# Patient Record
Sex: Female | Born: 1960 | Hispanic: No | Marital: Married | Smoking: Never smoker
Health system: Southern US, Community
[De-identification: ages and names within clinical notes are randomized; demographics above are authoritative.]

## PROBLEM LIST (undated history)

## (undated) DIAGNOSIS — N393 Stress incontinence (female) (male): Secondary | ICD-10-CM

## (undated) HISTORY — PX: APPENDECTOMY: SHX54

---

## 2003-01-07 ENCOUNTER — Other Ambulatory Visit: Admission: RE | Admit: 2003-01-07 | Discharge: 2003-01-07 | Payer: Self-pay | Admitting: Obstetrics and Gynecology

## 2004-08-10 ENCOUNTER — Other Ambulatory Visit: Admission: RE | Admit: 2004-08-10 | Discharge: 2004-08-10 | Payer: Self-pay | Admitting: Obstetrics and Gynecology

## 2005-09-06 ENCOUNTER — Other Ambulatory Visit: Admission: RE | Admit: 2005-09-06 | Discharge: 2005-09-06 | Payer: Self-pay | Admitting: Obstetrics and Gynecology

## 2006-10-31 ENCOUNTER — Encounter: Admission: RE | Admit: 2006-10-31 | Discharge: 2006-10-31 | Payer: Self-pay | Admitting: Obstetrics and Gynecology

## 2010-08-23 ENCOUNTER — Encounter: Payer: Self-pay | Admitting: Obstetrics and Gynecology

## 2013-02-28 NOTE — H&P (Signed)
  Patient name  Sheena, Green DICTATION#  [id number] CSN# [id number]  [facility], MD 02/28/2013 6:19 AM

## 2013-03-01 NOTE — H&P (Signed)
NAME:  Sheena Green, Sheena Green NO.:  [id number]  MEDICAL RECORD NO.:  [id number]  LOCATION:                                 FACILITY:  PHYSICIAN:  Juluis Mire, M.D.        DATE OF BIRTH:  DATE OF ADMISSION: DATE OF DISCHARGE:                             HISTORY & PHYSICAL   The date of her surgery is March 09, 2013, at [facility], [facility] outpatient area.  HISTORY OF PRESENT ILLNESS:  The patient is a 52 year old, gravida 3, para 2, married female who presents for mid urethral sling and cystoscopy.  In relation to the present admission, the patient has been having trouble with worsening stress incontinence.  She leaks with coughing, sneezing, and activity.  This has become a limiting issue.  Because of this, she underwent urodynamic testing in the office.  She had a minimal residual.  She had normal leak point pressures.  She had one of those spuriously low at 50, but the rest were okay.  Her urethral pressure profile was normal.  There was no evidence of any type of overactive bladder activity.  Now in consultation, we also discussed options including physical therapy versus mid urethral sling for which she comes in at the present time.  ALLERGIES:  No known drug allergies.  MEDICATIONS:  She has occasional Advil and Singulair as needed for allergies.  MEDICAL HISTORY:  Usual childhood diseases.  Does admit significant sequelae.  She has had an appendectomy in 1972, and she has had 2 vaginal deliveries.  FAMILY HISTORY:  Noncontributory.  SOCIAL HISTORY:  No tobacco and only occasional alcohol use.  REVIEW OF SYSTEMS:  Noncontributory.  PHYSICAL EXAMINATION:  VITAL SIGNS:  The patient is afebrile with stable vital signs. HEENT:  The patient is normocephalic.  Pupils equal, reactive to light and accommodation.  Extraocular movements were intact.  Sclerae and conjunctivae clear.  Oropharynx clear. NECK:  Without thyromegaly. BREASTS:  Not  examined. LUNGS:  Clear. CARDIOVASCULAR SYSTEM:  Regular rhythm and rate without murmurs or gallops. ABDOMEN:  Benign.  No mass, organomegaly, or tenderness. PELVIC:  Normal external genitalia.  Vaginal mucosa, mild cystourethrocele, cervix unremarkable.  Uterus normal size, shape, contour.  Adnexa free of masses or tenderness. EXTREMITIES:  Trace edema. NEUROLOGIC:  Grossly within normal limits.  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  PLAN:  The patient will undergo a mid urethral sling and cystoscopy.  We are going to do a transobturator approach.  Success rates of 85% are quoted.  Obviously this is not always completely expendable.  The risk has been explained including risk of infection, risk of possible vascular injury that could lead to hemorrhage requiring transfusion, the risk of AIDS or hepatitis, injury to adjacent organs that could include bladder, urethra, and ureters that could require further exploratory surgery, risk of deep venous thrombosis and pulmonary embolus.  With mesh, it was pointed that suburethral mesh does not carry the same risk as the larger sheath of mesh needs for vaginal reconstruction, however, there can include complications such as mesh erosion, rare risk of mesh rejection, and possible onset of painful intercourse.  There is also  the risk of overtightening the mesh that could lead to an obstructive voiding pattern requiring loosening the mesh with possible return of incontinence.  Risk of development of bladder spasms, this could require medical therapy.  Risk of obturator nerve injury with chronic leg pain and weakness.  The patient does understand the indications, risks and other alternatives.     Juluis Mire, M.D.     JSM/MEDQ  D:  02/28/2013  T:  02/28/2013  Job:  [id number]

## 2013-03-02 ENCOUNTER — Encounter (HOSPITAL_BASED_OUTPATIENT_CLINIC_OR_DEPARTMENT_OTHER): Payer: Self-pay | Admitting: *Deleted

## 2013-03-02 NOTE — Progress Notes (Signed)
NPO AFTER MN. ARRIVES AT 0600.  LAB WORK TO BE DONE Monday 03-05-2013 AT 1300.

## 2013-03-05 LAB — CBC
HCT: 40.9 % (ref 36.0–46.0)
MCH: 31.4 pg (ref 26.0–34.0)
MCV: 93.6 fL (ref 78.0–100.0)
RBC: 4.37 MIL/uL (ref 3.87–5.11)
WBC: 6.6 10*3/uL (ref 4.0–10.5)

## 2013-03-09 ENCOUNTER — Ambulatory Visit (HOSPITAL_BASED_OUTPATIENT_CLINIC_OR_DEPARTMENT_OTHER)
Admission: RE | Admit: 2013-03-09 | Discharge: 2013-03-09 | Disposition: A | Discharge status: Home / Self Care | Payer: Managed Care, Other (non HMO) | Source: Ambulatory Visit | Attending: Obstetrics and Gynecology | Admitting: Obstetrics and Gynecology

## 2013-03-09 ENCOUNTER — Ambulatory Visit (HOSPITAL_BASED_OUTPATIENT_CLINIC_OR_DEPARTMENT_OTHER): Payer: Managed Care, Other (non HMO) | Admitting: Anesthesiology

## 2013-03-09 ENCOUNTER — Encounter (HOSPITAL_BASED_OUTPATIENT_CLINIC_OR_DEPARTMENT_OTHER)
Admission: RE | Disposition: A | Discharge status: Home / Self Care | Payer: Self-pay | Source: Ambulatory Visit | Attending: Obstetrics and Gynecology

## 2013-03-09 ENCOUNTER — Encounter (HOSPITAL_BASED_OUTPATIENT_CLINIC_OR_DEPARTMENT_OTHER): Payer: Self-pay | Admitting: Anesthesiology

## 2013-03-09 ENCOUNTER — Encounter (HOSPITAL_BASED_OUTPATIENT_CLINIC_OR_DEPARTMENT_OTHER): Payer: Self-pay

## 2013-03-09 DIAGNOSIS — N393 Stress incontinence (female) (male): Secondary | ICD-10-CM | POA: Diagnosis present

## 2013-03-09 DIAGNOSIS — T83712A Erosion of implanted urethral mesh to surrounding organ or tissue, initial encounter: Secondary | ICD-10-CM

## 2013-03-09 DIAGNOSIS — N3641 Hypermobility of urethra: Secondary | ICD-10-CM | POA: Insufficient documentation

## 2013-03-09 HISTORY — PX: PUBOVAGINAL SLING: SHX1035

## 2013-03-09 HISTORY — DX: Stress incontinence (female) (male): N39.3

## 2013-03-09 HISTORY — PX: CYSTOSCOPY: SHX5120

## 2013-03-09 SURGERY — CREATION, PUBOVAGINAL SLING
Anesthesia: General | Site: Vagina | Wound class: Clean Contaminated

## 2013-03-09 MED ORDER — PROMETHAZINE HCL 25 MG/ML IJ SOLN
6.2500 mg | INTRAMUSCULAR | Status: DC | PRN
  Filled 2013-03-09: qty 1

## 2013-03-09 MED ORDER — DEXAMETHASONE SODIUM PHOSPHATE 4 MG/ML IJ SOLN
INTRAMUSCULAR | Status: DC | PRN
  Administered 2013-03-09: 10 mg via INTRAVENOUS

## 2013-03-09 MED ORDER — KETOROLAC TROMETHAMINE 30 MG/ML IJ SOLN
INTRAMUSCULAR | Status: DC | PRN
  Administered 2013-03-09: 30 mg via INTRAVENOUS

## 2013-03-09 MED ORDER — LACTATED RINGERS IV SOLN
INTRAVENOUS | Status: DC
  Administered 2013-03-09 (×2): via INTRAVENOUS
  Filled 2013-03-09: qty 1000

## 2013-03-09 MED ORDER — MIDAZOLAM HCL 5 MG/5ML IJ SOLN
INTRAMUSCULAR | Status: DC | PRN
  Administered 2013-03-09: 2 mg via INTRAVENOUS

## 2013-03-09 MED ORDER — OXYCODONE HCL 5 MG/5ML PO SOLN
5.0000 mg | Freq: Once | ORAL | Status: DC | PRN
  Filled 2013-03-09: qty 5

## 2013-03-09 MED ORDER — OXYCODONE HCL 5 MG PO TABS
5.0000 mg | ORAL_TABLET | Freq: Once | ORAL | Status: DC | PRN
  Filled 2013-03-09: qty 1

## 2013-03-09 MED ORDER — ONDANSETRON HCL 4 MG/2ML IJ SOLN
INTRAMUSCULAR | Status: DC | PRN
  Administered 2013-03-09: 4 mg via INTRAVENOUS

## 2013-03-09 MED ORDER — CEFAZOLIN SODIUM-DEXTROSE 2-3 GM-% IV SOLR
2.0000 g | INTRAVENOUS | Status: AC
Start: 2013-03-09 — End: 2013-03-09
  Administered 2013-03-09: 2 g via INTRAVENOUS
  Filled 2013-03-09: qty 50

## 2013-03-09 MED ORDER — MEPERIDINE HCL 25 MG/ML IJ SOLN
6.2500 mg | INTRAMUSCULAR | Status: DC | PRN
  Filled 2013-03-09: qty 1

## 2013-03-09 MED ORDER — INDIGOTINDISULFONATE SODIUM 8 MG/ML IJ SOLN
INTRAMUSCULAR | Status: DC | PRN
  Administered 2013-03-09: 5 mL via INTRAVENOUS

## 2013-03-09 MED ORDER — OXYCODONE-ACETAMINOPHEN 7.5-325 MG PO TABS: 1.0000 | ORAL_TABLET | ORAL | Status: AC | PRN

## 2013-03-09 MED ORDER — HYDROMORPHONE HCL PF 1 MG/ML IJ SOLN
0.2500 mg | INTRAMUSCULAR | Status: DC | PRN
  Filled 2013-03-09: qty 1

## 2013-03-09 MED ORDER — LIDOCAINE HCL (CARDIAC) 20 MG/ML IV SOLN
INTRAVENOUS | Status: DC | PRN
  Administered 2013-03-09: 80 mg via INTRAVENOUS

## 2013-03-09 MED ORDER — FENTANYL CITRATE 0.05 MG/ML IJ SOLN
INTRAMUSCULAR | Status: DC | PRN
  Administered 2013-03-09: 50 ug via INTRAVENOUS
  Administered 2013-03-09 (×2): 25 ug via INTRAVENOUS

## 2013-03-09 MED ORDER — PROPOFOL 10 MG/ML IV BOLUS
INTRAVENOUS | Status: DC | PRN
  Administered 2013-03-09: 200 mg via INTRAVENOUS

## 2013-03-09 MED ORDER — BUPIVACAINE-EPINEPHRINE 0.25% -1:200000 IJ SOLN
INTRAMUSCULAR | Status: DC | PRN
  Administered 2013-03-09: 15 mL

## 2013-03-09 MED ORDER — STERILE WATER FOR IRRIGATION IR SOLN
Status: DC | PRN
  Administered 2013-03-09: 3000 mL

## 2013-03-09 SURGICAL SUPPLY — 47 items
ADH SKN CLS APL DERMABOND .7 (GAUZE/BANDAGES/DRESSINGS) ×2
BAG DRAIN URO-CYSTO SKYTR STRL (DRAIN) ×3 IMPLANT
BAG DRN UROCATH (DRAIN) ×2
BAG URINE DRAINAGE (UROLOGICAL SUPPLIES) ×3 IMPLANT
BAG URO CATCHER STRL LF (DRAPE) ×3 IMPLANT
BLADE SURG 15 STRL LF DISP TIS (BLADE) ×4 IMPLANT
BLADE SURG 15 STRL SS (BLADE) ×6
CANISTER SUCT LVC 12 LTR MEDI- (MISCELLANEOUS) ×1 IMPLANT
CANISTER SUCTION 2500CC (MISCELLANEOUS) ×3 IMPLANT
CATH FOLEY 2WAY SLVR  5CC 16FR (CATHETERS) ×1
CATH FOLEY 2WAY SLVR 5CC 16FR (CATHETERS) ×2 IMPLANT
CLOTH BEACON ORANGE TIMEOUT ST (SAFETY) ×3 IMPLANT
DECANTER SPIKE VIAL GLASS SM (MISCELLANEOUS) IMPLANT
DERMABOND ADVANCED (GAUZE/BANDAGES/DRESSINGS) ×1
DERMABOND ADVANCED .7 DNX12 (GAUZE/BANDAGES/DRESSINGS) ×2 IMPLANT
DRAPE CAMERA CLOSED 9X96 (DRAPES) ×3 IMPLANT
DRAPE LG THREE QUARTER DISP (DRAPES) ×3 IMPLANT
ELECT REM PT RETURN 9FT ADLT (ELECTROSURGICAL) ×3
ELECTRODE REM PT RTRN 9FT ADLT (ELECTROSURGICAL) ×2 IMPLANT
GLOVE BIO SURGEON STRL SZ 6.5 (GLOVE) ×3 IMPLANT
GLOVE BIO SURGEON STRL SZ7 (GLOVE) ×3 IMPLANT
GLOVE ECLIPSE 7.0 STRL STRAW (GLOVE) ×1 IMPLANT
GLOVE INDICATOR 7.5 STRL GRN (GLOVE) ×1 IMPLANT
GOWN PREVENTION PLUS LG XLONG (DISPOSABLE) ×3 IMPLANT
GOWN STRL REIN XL XLG (GOWN DISPOSABLE) ×3 IMPLANT
HOLDER FOLEY CATH W/STRAP (MISCELLANEOUS) ×3 IMPLANT
NDL SPNL 22GX3.5 QUINCKE BK (NEEDLE) IMPLANT
NEEDLE HYPO 22GX1.5 SAFETY (NEEDLE) ×3 IMPLANT
NEEDLE SPNL 22GX3.5 QUINCKE BK (NEEDLE) IMPLANT
NS IRRIG 500ML POUR BTL (IV SOLUTION) ×3 IMPLANT
PACK BASIN DAY SURGERY FS (CUSTOM PROCEDURE TRAY) ×3 IMPLANT
PACK CYSTOSCOPY (CUSTOM PROCEDURE TRAY) ×3 IMPLANT
PACKING VAGINAL (PACKING) IMPLANT
PAD PREP 24X48 CUFFED NSTRL (MISCELLANEOUS) ×3 IMPLANT
PENCIL BUTTON HOLSTER BLD 10FT (ELECTRODE) ×3 IMPLANT
PLUG CATH AND CAP STER (CATHETERS) IMPLANT
SET IRRIG Y TYPE TUR BLADDER L (SET/KITS/TRAYS/PACK) ×3 IMPLANT
SLING HALO OBTRYX (Sling) ×1 IMPLANT
SUT VIC AB 2-0 UR6 27 (SUTURE) ×3 IMPLANT
SYR BULB IRRIGATION 50ML (SYRINGE) IMPLANT
SYRINGE 10CC LL (SYRINGE) ×3 IMPLANT
TOWEL OR 17X24 6PK STRL BLUE (TOWEL DISPOSABLE) ×7 IMPLANT
TRAY DSU PREP LF (CUSTOM PROCEDURE TRAY) ×3 IMPLANT
TUBE CONNECTING 12X1/4 (SUCTIONS) ×3 IMPLANT
WATER STERILE IRR 3000ML UROMA (IV SOLUTION) ×3 IMPLANT
WATER STERILE IRR 500ML POUR (IV SOLUTION) ×3 IMPLANT
YANKAUER SUCT BULB TIP NO VENT (SUCTIONS) ×3 IMPLANT

## 2013-03-09 NOTE — Op Note (Signed)
[name], Sheena Green Sheena Green  MEDICAL RECORD NO.:  [id number]  LOCATION:                                 FACILITY:  PHYSICIAN:  Juluis Mire, M.D.        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  OPERATIVE PROCEDURE:  Mid urethral sling using a transobturator approach.  Cystoscopy.  SURGEON:  Juluis Mire, M.D.  ANESTHESIA:  General endotracheal.  ESTIMATED BLOOD LOSS:  Minimal  PACKS:  None.  DRAINS:  Urethral Foley.  INTRAOPERATIVE BLOOD PLACED:  None.  COMPLICATIONS:  None.  INDICATIONS:  As dictated in history and physical.  DESCRIPTION OF PROCEDURE:  The patient was taken to the OR and placed in supine position.  After satisfactory level of general endotracheal anesthesia was obtained, the patient was placed in the dorsal lithotomy position using the Allen stirrups.  Perineum and vagina was cleansed with Betadine and draped in a sterile field.  A weighted speculum was placed in the vaginal vault.  The suburethral area was injected with 0.25% Marcaine with epinephrine.  We also injected out laterally towards the obturator foramen.  Two areas on the groin were identified at the level of the clitoris below the abductus longus muscles and laterally to the inferior pubic ramus.  These areas were marked and infiltrated with 0.25% Marcaine with epinephrine, and 2 punch incisions were made.  Next, the suburethral incision was made.  Using a sharp dissection, we were able to dissect out to the obturator foramen on each side.  The Obtryx system was brought into place.  Both probes were inserted through the skin around the inferior pubic ramus and out to the vaginal incision  on each side.  It did not appear we had buttonholed the vaginal mucosa with either probe.  Next, cystoscopy was performed.  There was no evidence of bladder or urethral injury.  The patient begin indigo carmine.  Jets of blue urine were seen streaming from both ureteral orifices.  The cystoscope was removed.  Foley was placed back in place.  The mesh was then brought in.  It was secured to the probes on each side and brought out through the skin incision.  The blue tab was cut.  We adjusted the mesh in the mid urethral area until lay flat, but it was loose and we were able to rotate a Kelly 90 degrees.  At this point in time, both the plastic sheaths were removed.  We again re-evaluated the mesh.  We had loosened it up slightly at this point in time, such that again it was loose, although lying flat.  Both mesh arms were trimmed.  At this point in time, cystoscopy was repeated.  There was no evidence of any mesh in the bladder itself.  Next, the suburethral incision was closed with running locking suture of 2-0 Vicryl.  The groin incisions were closed with Dermabond.  Foley was placed to straight drain.  The patient  was taken out of dorsal lithotomy position.  Once alert and extubated, transferred to recovery room in good condition.  Sponge, instrument, and needle count was correct by circulating nurse x2.     Juluis Mire, M.D.     JSM/MEDQ  D:  03/09/2013  T:  03/09/2013  Job:  [id number]

## 2013-03-09 NOTE — Op Note (Signed)
Patient name Sheena Green, Sheena Green DICTATION#  [id number] CSN# [id number]   [facility], MD 03/09/2013 8:13 AM

## 2013-03-09 NOTE — Progress Notes (Signed)
Dr. Arelia Sneddon called and informed of voiding 900 ml, Okay to discharge home.

## 2013-03-09 NOTE — Anesthesia Procedure Notes (Signed)
Procedure Name: LMA Insertion Date/Time: 03/09/2013 7:29 AM Performed by: Maris Berger T Pre-anesthesia Checklist: Patient identified, Emergency Drugs available, Suction available and Patient being monitored Patient Re-evaluated:Patient Re-evaluated prior to inductionOxygen Delivery Method: Circle System Utilized Preoxygenation: Pre-oxygenation with 100% oxygen Intubation Type: IV induction Ventilation: Mask ventilation without difficulty LMA: LMA inserted LMA Size: 4.0 Number of attempts: 1 Airway Equipment and Method: bite block Placement Confirmation: positive ETCO2 Dental Injury: Teeth and Oropharynx as per pre-operative assessment

## 2013-03-09 NOTE — Transfer of Care (Signed)
Immediate Anesthesia Transfer of Care Note  Patient: Sheena Green  Procedure(s) Performed: Procedure(s): PUBO-VAGINAL SLING/TRANSOBTURATOR SLING WITH (N/A) CYSTOSCOPY (N/A)  Patient Location: PACU  Anesthesia Type:General  Level of Consciousness: awake, alert  and oriented  Airway & Oxygen Therapy: Patient Spontanous Breathing and Patient connected to nasal cannula oxygen  Post-op Assessment: Report given to PACU RN  Post vital signs: Reviewed and stable  Complications: No apparent anesthesia complications

## 2013-03-09 NOTE — H&P (Signed)
  History and physical exam unchanged 

## 2013-03-09 NOTE — Anesthesia Postprocedure Evaluation (Signed)
Anesthesia Post Note  Patient: Sheena Green  Procedure(s) Performed: Procedure(s) (LRB): PUBO-VAGINAL SLING/TRANSOBTURATOR SLING WITH (N/A) CYSTOSCOPY (N/A)  Anesthesia type: General  Patient location: PACU  Post pain: Pain level controlled  Post assessment: Post-op Vital signs reviewed  Last Vitals: BP 110/65  Pulse 64  Temp(Src) 36.7 C (Oral)  Resp 16  Ht 5\' 6"  (1.676 m)  Wt 183 lb (83.008 kg)  BMI 29.55 kg/m2  SpO2 95%  LMP 02/27/2013  Post vital signs: Reviewed  Level of consciousness: sedated  Complications: No apparent anesthesia complications

## 2013-03-09 NOTE — Anesthesia Preprocedure Evaluation (Addendum)
Anesthesia Evaluation  Patient identified by MRN, date of birth, ID band Patient awake    Reviewed: Allergy & Precautions, H&P , NPO status , Patient's Chart, lab work & pertinent test results  Airway Mallampati: I TM Distance: >3 FB Neck ROM: Full    Dental  (+) Dental Advisory Given and Teeth Intact   Pulmonary neg pulmonary ROS,  breath sounds clear to auscultation        Cardiovascular negative cardio ROS  Rhythm:Regular Rate:Normal     Neuro/Psych negative neurological ROS  negative psych ROS   GI/Hepatic negative GI ROS, Neg liver ROS,   Endo/Other  negative endocrine ROS  Renal/GU negative Renal ROS     Musculoskeletal negative musculoskeletal ROS (+)   Abdominal   Peds  Hematology negative hematology ROS (+)   Anesthesia Other Findings   Reproductive/Obstetrics negative OB ROS                          Anesthesia Physical Anesthesia Plan  ASA: I  Anesthesia Plan: General   Post-op Pain Management:    Induction: Intravenous  Airway Management Planned: LMA  Additional Equipment:   Intra-op Plan:   Post-operative Plan: Extubation in OR  Informed Consent: I have reviewed the patients History and Physical, chart, labs and discussed the procedure including the risks, benefits and alternatives for the proposed anesthesia with the patient or authorized representative who has indicated his/her understanding and acceptance.   Dental advisory given  Plan Discussed with: CRNA  Anesthesia Plan Comments:         Anesthesia Quick Evaluation

## 2013-03-09 NOTE — Brief Op Note (Signed)
03/09/2013  8:13 AM  PATIENT:  Sheena Green  52 y.o. female  PRE-OPERATIVE DIAGNOSIS:  SUI  POST-OPERATIVE DIAGNOSIS:  SUI  PROCEDURE:  Procedure(s): PUBO-VAGINAL SLING/TRANSOBTURATOR SLING WITH (N/A) CYSTOSCOPY (N/A)  SURGEON:  Surgeon(s) and Role:    * Juluis Mire, MD - Primary  PHYSICIAN ASSISTANT:   ASSISTANTS: none   ANESTHESIA:   local and general  EBL:  Total I/O In: 100 [I.V.:100] Out: -   BLOOD ADMINISTERED:none  DRAINS: Urinary Catheter (Foley)   LOCAL MEDICATIONS USED:  MARCAINE     SPECIMEN:  No Specimen  DISPOSITION OF SPECIMEN:  N/A  COUNTS:  YES  TOURNIQUET:  * No tourniquets in log *  DICTATION: .Other Dictation: Dictation Number [id number]  PLAN OF CARE: Discharge to home after PACU  PATIENT DISPOSITION:  PACU - hemodynamically stable.   Delay start of Pharmacological VTE agent (>24hrs) due to surgical blood loss or risk of bleeding: not applicable

## 2013-03-13 ENCOUNTER — Encounter (HOSPITAL_BASED_OUTPATIENT_CLINIC_OR_DEPARTMENT_OTHER): Payer: Self-pay | Admitting: Obstetrics and Gynecology

## 2017-10-21 ENCOUNTER — Other Ambulatory Visit: Payer: Self-pay | Admitting: Obstetrics and Gynecology

## 2017-10-21 DIAGNOSIS — E041 Nontoxic single thyroid nodule: Secondary | ICD-10-CM

## 2017-10-28 ENCOUNTER — Ambulatory Visit
Admission: RE | Admit: 2017-10-28 | Discharge: 2017-10-28 | Disposition: A | Discharge status: Home / Self Care | Payer: Managed Care, Other (non HMO) | Source: Ambulatory Visit | Attending: Obstetrics and Gynecology | Admitting: Obstetrics and Gynecology

## 2017-10-28 DIAGNOSIS — E041 Nontoxic single thyroid nodule: Secondary | ICD-10-CM

## 2017-11-07 ENCOUNTER — Other Ambulatory Visit: Payer: Self-pay | Admitting: Obstetrics and Gynecology

## 2017-11-07 DIAGNOSIS — E041 Nontoxic single thyroid nodule: Secondary | ICD-10-CM

## 2017-12-20 ENCOUNTER — Other Ambulatory Visit (HOSPITAL_COMMUNITY)
Admission: RE | Admit: 2017-12-20 | Discharge: 2017-12-20 | Disposition: A | Discharge status: Home / Self Care | Payer: Managed Care, Other (non HMO) | Source: Ambulatory Visit | Attending: Physician Assistant | Admitting: Physician Assistant

## 2017-12-20 ENCOUNTER — Ambulatory Visit
Admission: RE | Admit: 2017-12-20 | Discharge: 2017-12-20 | Disposition: A | Discharge status: Home / Self Care | Payer: Managed Care, Other (non HMO) | Source: Ambulatory Visit | Attending: Obstetrics and Gynecology | Admitting: Obstetrics and Gynecology

## 2017-12-20 DIAGNOSIS — E041 Nontoxic single thyroid nodule: Secondary | ICD-10-CM | POA: Diagnosis present

## 2017-12-20 NOTE — Procedures (Signed)
PROCEDURE SUMMARY:  Using direct ultrasound guidance, 3 passes were made using 25 g needles into the nodule within the left mid and superior lobe of the thyroid and the right mid lobe of the thyroid.   Ultrasound was used to confirm needle placements on all occasions.   Specimens were sent to Pathology for analysis.  See procedure note under Imaging tab in Epic for full procedure details.  Terrianna Holsclaw S San Lohmeyer PA-C 12/20/2017 3:19 PM

## 2018-01-27 ENCOUNTER — Other Ambulatory Visit: Payer: Self-pay | Admitting: Surgery

## 2018-01-27 DIAGNOSIS — E042 Nontoxic multinodular goiter: Secondary | ICD-10-CM

## 2018-02-01 ENCOUNTER — Other Ambulatory Visit: Payer: Self-pay | Admitting: Surgery

## 2018-02-01 DIAGNOSIS — E042 Nontoxic multinodular goiter: Secondary | ICD-10-CM

## 2018-02-21 ENCOUNTER — Ambulatory Visit
Admission: RE | Admit: 2018-02-21 | Discharge: 2018-02-21 | Disposition: A | Discharge status: Home / Self Care | Payer: Managed Care, Other (non HMO) | Source: Ambulatory Visit | Attending: Surgery | Admitting: Surgery

## 2018-02-21 ENCOUNTER — Other Ambulatory Visit (HOSPITAL_COMMUNITY)
Admission: RE | Admit: 2018-02-21 | Discharge: 2018-02-21 | Disposition: A | Discharge status: Home / Self Care | Payer: Managed Care, Other (non HMO) | Source: Ambulatory Visit | Attending: Student | Admitting: Student

## 2018-02-21 DIAGNOSIS — E042 Nontoxic multinodular goiter: Secondary | ICD-10-CM

## 2018-02-21 DIAGNOSIS — E041 Nontoxic single thyroid nodule: Secondary | ICD-10-CM | POA: Diagnosis present

## 2018-03-07 ENCOUNTER — Encounter (HOSPITAL_COMMUNITY): Payer: Self-pay

## 2019-01-12 ENCOUNTER — Other Ambulatory Visit: Payer: Self-pay | Admitting: Surgery

## 2019-01-12 DIAGNOSIS — E042 Nontoxic multinodular goiter: Secondary | ICD-10-CM

## 2019-06-21 ENCOUNTER — Other Ambulatory Visit: Payer: Self-pay

## 2019-06-21 DIAGNOSIS — Z20822 Contact with and (suspected) exposure to covid-19: Secondary | ICD-10-CM

## 2019-06-23 LAB — NOVEL CORONAVIRUS, NAA: SARS-CoV-2, NAA: NOT DETECTED

## 2019-09-13 ENCOUNTER — Ambulatory Visit: Payer: Self-pay

## 2020-06-15 ENCOUNTER — Ambulatory Visit: Payer: Self-pay | Attending: Internal Medicine

## 2020-06-15 DIAGNOSIS — Z23 Encounter for immunization: Secondary | ICD-10-CM

## 2020-06-15 NOTE — Progress Notes (Signed)
   Covid-19 Vaccination Clinic  Name:  Sheena Green    MRN: [id number] DOB: 12/16/1960  06/15/2020  [name] was observed post Covid-19 immunization for 15 minutes without incident. She was provided with Vaccine Information Sheet and instruction to access the V-Safe system.   [name] was instructed to call 911 with any severe reactions post vaccine: Marland Kitchen Difficulty breathing  . Swelling of face and throat  . A fast heartbeat  . A bad rash all over body  . Dizziness and weakness   Immunizations Administered    Name Date Dose VIS Date Route   Pfizer COVID-19 Vaccine 06/15/2020 12:37 PM 0.3 mL 05/21/2020 Intramuscular   Manufacturer: [place]   Lot: [id number]   [place]: [id number]

## 2020-06-23 ENCOUNTER — Other Ambulatory Visit: Payer: Self-pay | Admitting: Surgery

## 2020-06-23 DIAGNOSIS — E042 Nontoxic multinodular goiter: Secondary | ICD-10-CM

## 2020-06-23 DIAGNOSIS — D440 Neoplasm of uncertain behavior of thyroid gland: Secondary | ICD-10-CM

## 2020-07-08 ENCOUNTER — Ambulatory Visit
Admission: RE | Admit: 2020-07-08 | Discharge: 2020-07-08 | Disposition: A | Discharge status: Home / Self Care | Payer: Self-pay | Source: Ambulatory Visit | Attending: Surgery | Admitting: Surgery

## 2020-07-08 DIAGNOSIS — E042 Nontoxic multinodular goiter: Secondary | ICD-10-CM

## 2020-07-08 DIAGNOSIS — D440 Neoplasm of uncertain behavior of thyroid gland: Secondary | ICD-10-CM

## 2021-08-05 ENCOUNTER — Other Ambulatory Visit: Payer: Self-pay | Admitting: Surgery

## 2021-08-05 DIAGNOSIS — E042 Nontoxic multinodular goiter: Secondary | ICD-10-CM

## 2021-08-13 DIAGNOSIS — E042 Nontoxic multinodular goiter: Secondary | ICD-10-CM | POA: Diagnosis not present

## 2021-08-20 ENCOUNTER — Ambulatory Visit
Admission: RE | Admit: 2021-08-20 | Discharge: 2021-08-20 | Disposition: A | Discharge status: Home / Self Care | Payer: BC Managed Care – PPO | Source: Ambulatory Visit | Attending: Surgery | Admitting: Surgery

## 2021-08-20 DIAGNOSIS — E041 Nontoxic single thyroid nodule: Secondary | ICD-10-CM | POA: Diagnosis not present

## 2021-08-20 DIAGNOSIS — E042 Nontoxic multinodular goiter: Secondary | ICD-10-CM

## 2021-09-14 DIAGNOSIS — E042 Nontoxic multinodular goiter: Secondary | ICD-10-CM | POA: Diagnosis not present

## 2021-10-08 ENCOUNTER — Other Ambulatory Visit: Payer: Self-pay | Admitting: Family Medicine

## 2021-10-08 ENCOUNTER — Other Ambulatory Visit (HOSPITAL_COMMUNITY): Payer: Self-pay | Admitting: Family Medicine

## 2021-10-08 DIAGNOSIS — Z79899 Other long term (current) drug therapy: Secondary | ICD-10-CM | POA: Diagnosis not present

## 2021-10-08 DIAGNOSIS — E7800 Pure hypercholesterolemia, unspecified: Secondary | ICD-10-CM

## 2021-10-08 DIAGNOSIS — R7301 Impaired fasting glucose: Secondary | ICD-10-CM | POA: Diagnosis not present

## 2021-10-08 DIAGNOSIS — Z0000 Encounter for general adult medical examination without abnormal findings: Secondary | ICD-10-CM | POA: Diagnosis not present

## 2021-10-27 ENCOUNTER — Other Ambulatory Visit: Payer: Self-pay

## 2021-10-27 ENCOUNTER — Ambulatory Visit (HOSPITAL_COMMUNITY)
Admission: RE | Admit: 2021-10-27 | Discharge: 2021-10-27 | Disposition: A | Discharge status: Home / Self Care | Payer: Self-pay | Source: Ambulatory Visit | Attending: Family Medicine | Admitting: Family Medicine

## 2021-10-27 DIAGNOSIS — E7800 Pure hypercholesterolemia, unspecified: Secondary | ICD-10-CM | POA: Insufficient documentation

## 2022-08-05 DIAGNOSIS — L821 Other seborrheic keratosis: Secondary | ICD-10-CM | POA: Diagnosis not present

## 2022-08-05 DIAGNOSIS — D2262 Melanocytic nevi of left upper limb, including shoulder: Secondary | ICD-10-CM | POA: Diagnosis not present

## 2022-08-05 DIAGNOSIS — D225 Melanocytic nevi of trunk: Secondary | ICD-10-CM | POA: Diagnosis not present

## 2022-08-05 DIAGNOSIS — D485 Neoplasm of uncertain behavior of skin: Secondary | ICD-10-CM | POA: Diagnosis not present

## 2022-08-05 DIAGNOSIS — D2261 Melanocytic nevi of right upper limb, including shoulder: Secondary | ICD-10-CM | POA: Diagnosis not present

## 2022-08-26 DIAGNOSIS — L988 Other specified disorders of the skin and subcutaneous tissue: Secondary | ICD-10-CM | POA: Diagnosis not present

## 2022-08-26 DIAGNOSIS — D485 Neoplasm of uncertain behavior of skin: Secondary | ICD-10-CM | POA: Diagnosis not present

## 2022-09-17 DIAGNOSIS — Z79899 Other long term (current) drug therapy: Secondary | ICD-10-CM | POA: Diagnosis not present

## 2022-09-22 DIAGNOSIS — H903 Sensorineural hearing loss, bilateral: Secondary | ICD-10-CM | POA: Diagnosis not present

## 2022-09-22 DIAGNOSIS — H9311 Tinnitus, right ear: Secondary | ICD-10-CM | POA: Diagnosis not present

## 2022-10-05 DIAGNOSIS — Z6831 Body mass index (BMI) 31.0-31.9, adult: Secondary | ICD-10-CM | POA: Diagnosis not present

## 2022-10-05 DIAGNOSIS — Z124 Encounter for screening for malignant neoplasm of cervix: Secondary | ICD-10-CM | POA: Diagnosis not present

## 2022-10-05 DIAGNOSIS — Z1151 Encounter for screening for human papillomavirus (HPV): Secondary | ICD-10-CM | POA: Diagnosis not present

## 2022-10-05 DIAGNOSIS — Z1231 Encounter for screening mammogram for malignant neoplasm of breast: Secondary | ICD-10-CM | POA: Diagnosis not present

## 2022-10-05 DIAGNOSIS — Z01419 Encounter for gynecological examination (general) (routine) without abnormal findings: Secondary | ICD-10-CM | POA: Diagnosis not present

## 2022-10-08 IMAGING — US US THYROID
1 series · 13 of 25 positions shown · non-contrast
Comparison: 07/08/2020

02/21/2018

12/20/2017

10/28/2017

CLINICAL DATA: Thyroid nodule follow-up

EXAM:
THYROID ULTRASOUND
TECHNIQUE: Ultrasound examination of the thyroid gland and adjacent soft
tissues was performed.

[Series 1: us thyroid · 0.08mm/px · 13 of 68 slices shown]
[im 1/68]
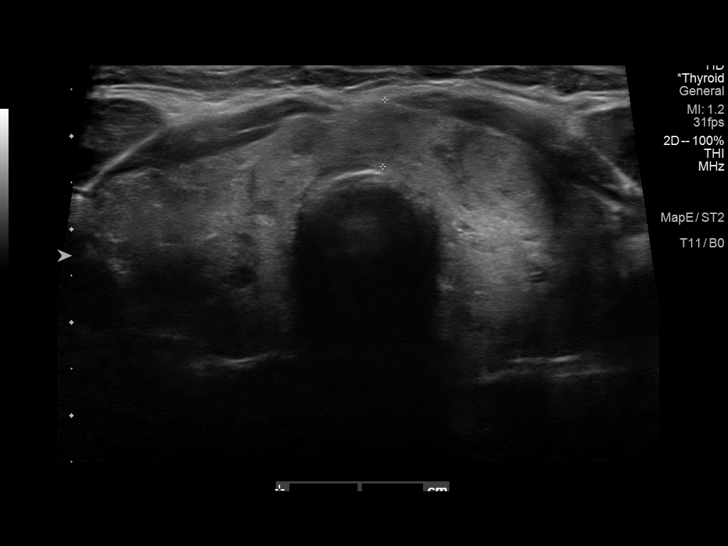
[im 6/68]
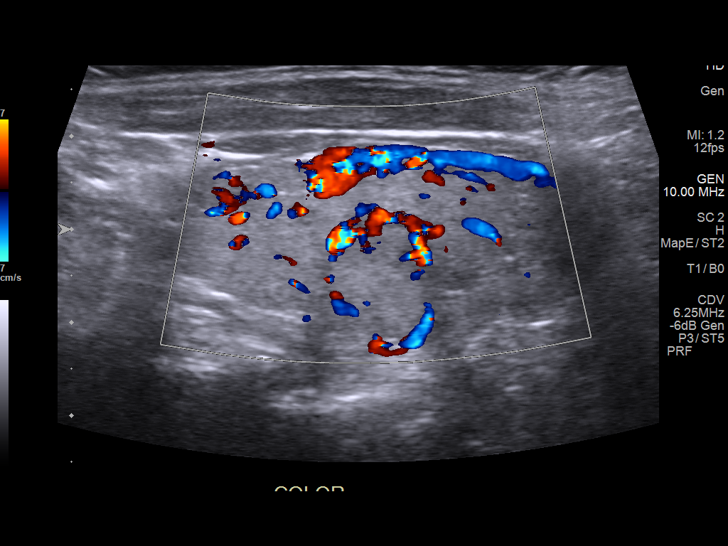
[im 12/68]
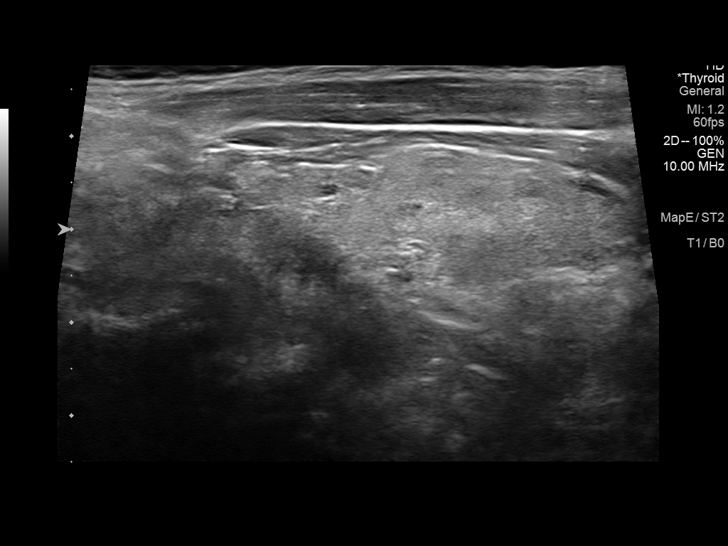
[im 17/68]
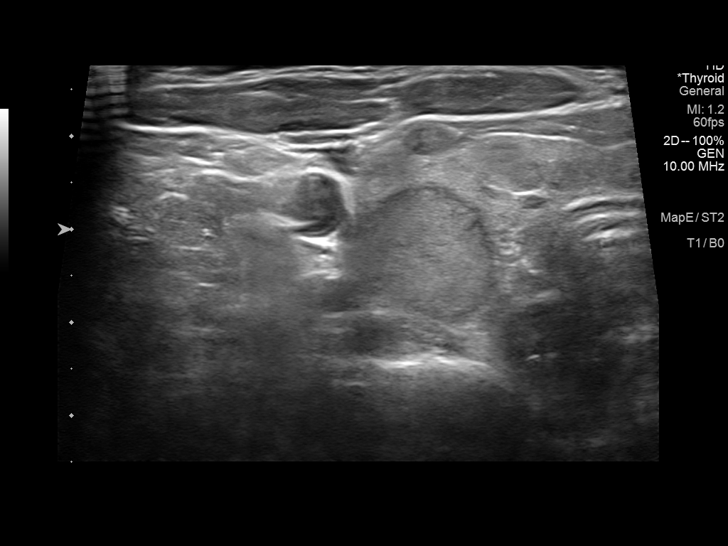
[im 23/68]
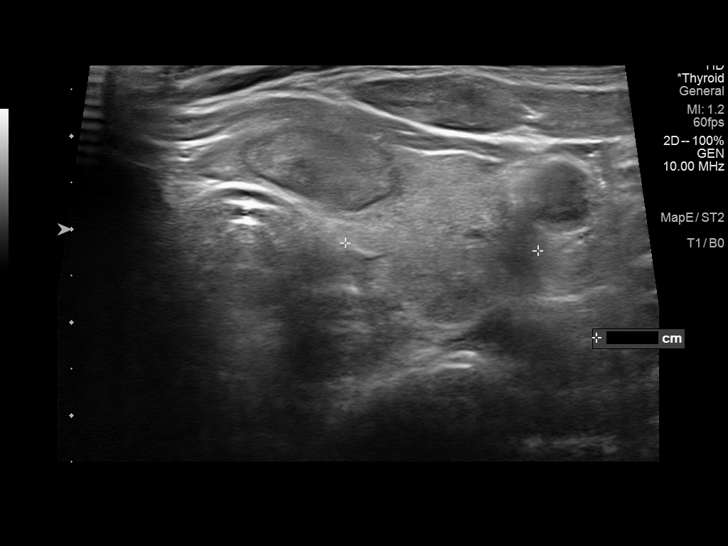
[im 28/68]
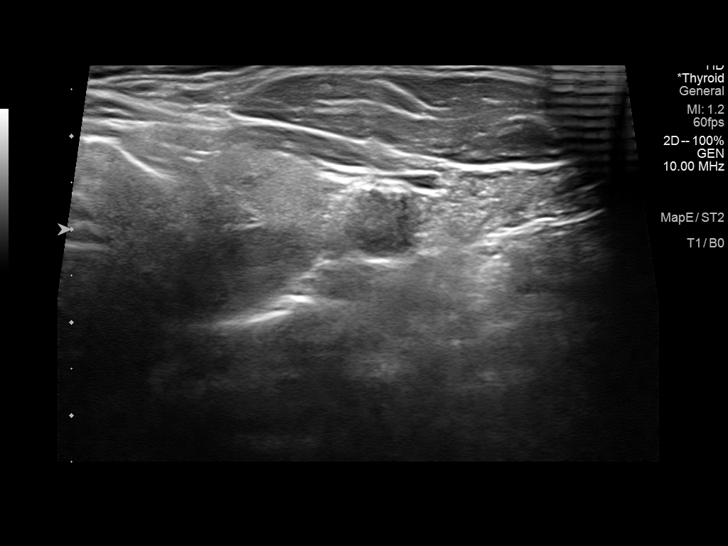
[im 34/68]
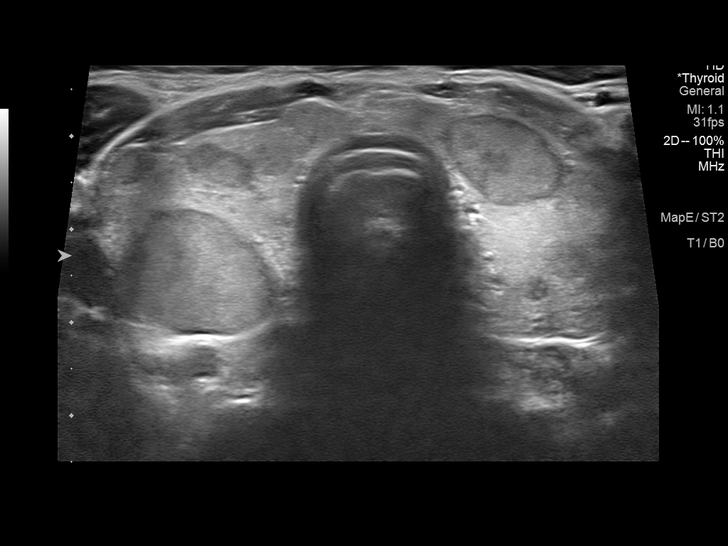
[im 40/68]
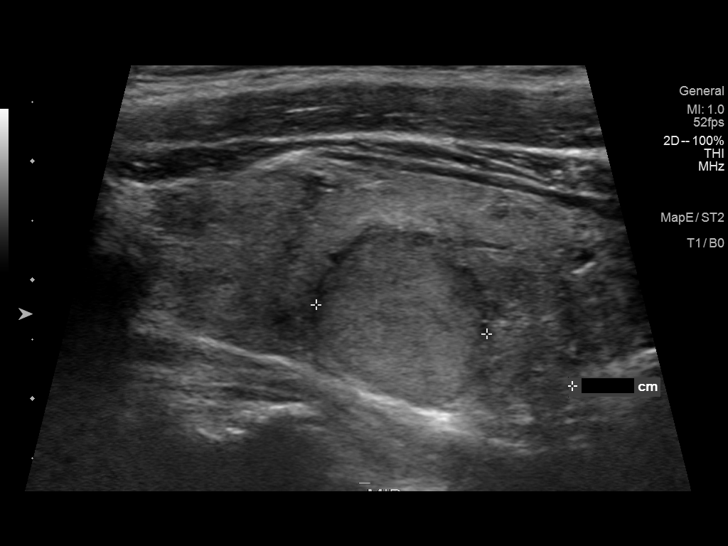
[im 45/68]
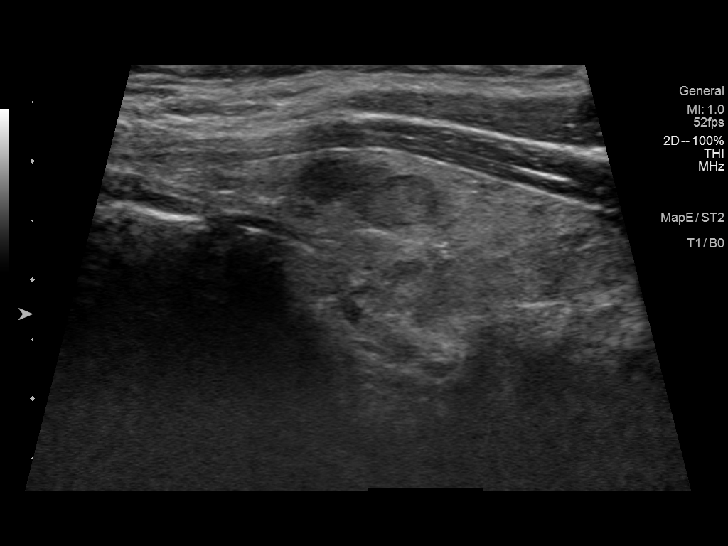
[im 51/68]
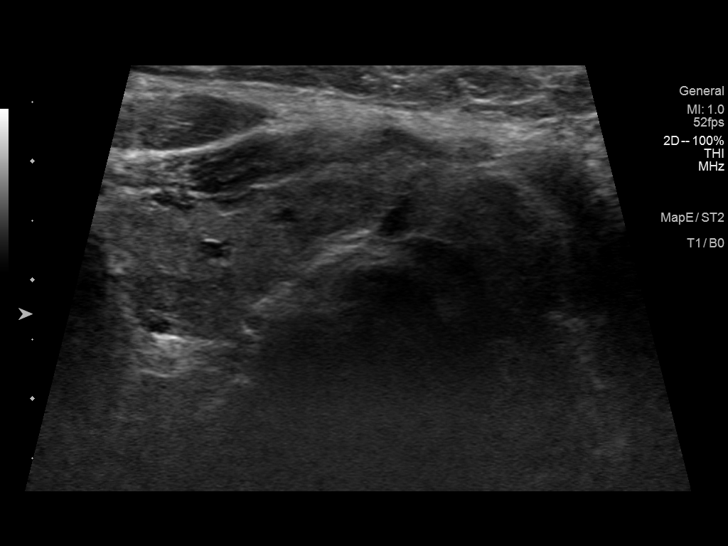
[im 56/68]
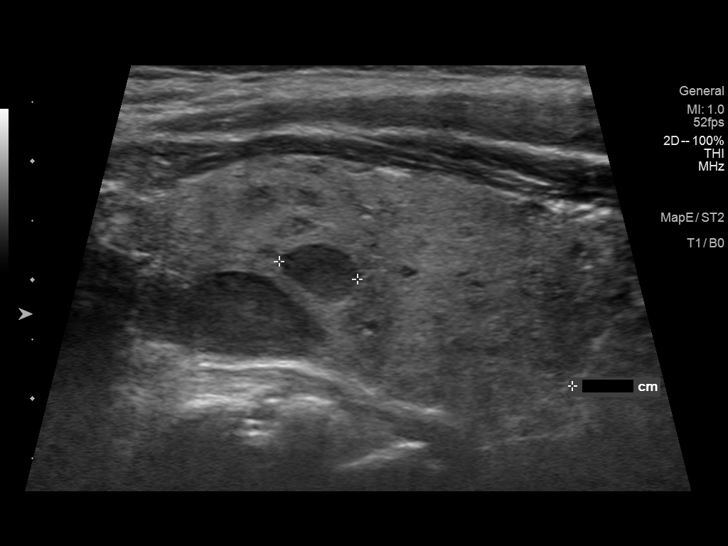
[im 62/68]
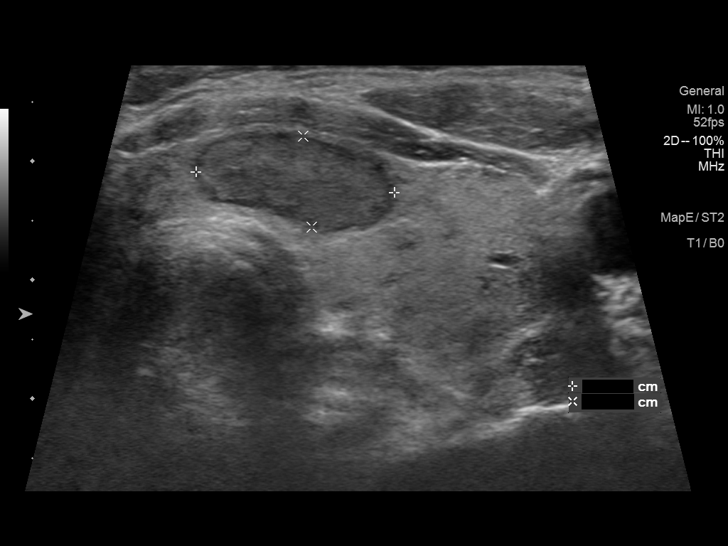
[im 68/68]
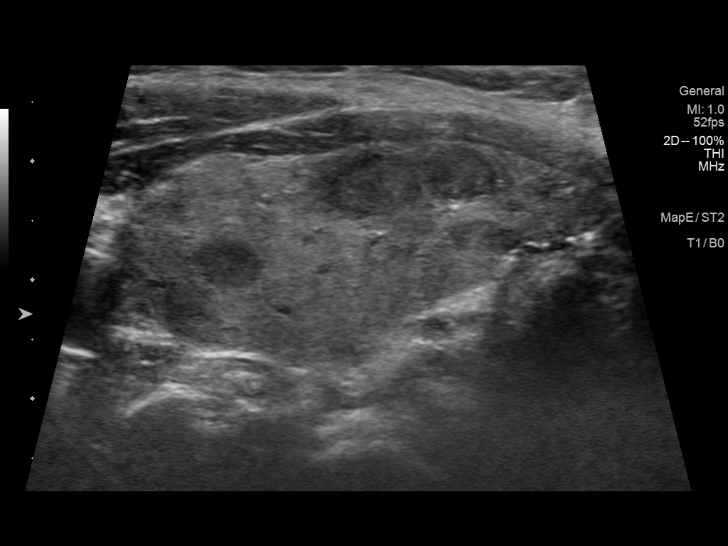

[13 of 25 positions shown; findings below may reference images not displayed]

FINDINGS: Parenchymal Echotexture: Mildly heterogeneous

Isthmus: 0.7 cm

Right lobe: 4.9 x 2.1 x 2.2 cm

Left lobe: 5.2 x 2.0 x 2.1 cm

_________________________________________________________

Estimated total number of nodules >/= 1 cm: 4

Number of spongiform nodules >/=  2 cm not described below (TR1): 0

Number of mixed cystic and solid nodules >/= 1.5 cm not described
below (TR2): 0

_________________________________________________________

Nodule 2: 1.5 x 1.4 x 1.4 cm solid isoechoic nodule in the mid right
thyroid lobe is not significantly changed in size since prior
examination. Prior FNA was performed on 02/21/2018. Please correlate
with prior FNA results.

_________________________________________________________

Nodule 5: 1.6 x 1.5 x 0.9 cm left superior thyroid nodule is not
significantly changed since prior examination. Please correlate with
prior FNA results from 02/21/2018.

_________________________________________________________

Nodule 7: 2.1 x 1.7 x 0.8 cm left inferior thyroid nodule is not
significantly changed in size since prior examination. Please
correlate with prior FNA results from 02/21/2018.

_________________________________________________________

Remaining bilateral thyroid nodules do not meet criteria for FNA or
imaging surveillance.
IMPRESSION: 1. No new thyroid nodules which meet criteria for FNA or imaging
follow-up.
2. Previously biopsied bilateral thyroid nodules are not
significantly changed in size.

The above is in keeping with the ACR TI-RADS recommendations - [HOSPITAL] 7610;[DATE].

## 2022-10-13 DIAGNOSIS — R7301 Impaired fasting glucose: Secondary | ICD-10-CM | POA: Diagnosis not present

## 2022-10-13 DIAGNOSIS — Z0000 Encounter for general adult medical examination without abnormal findings: Secondary | ICD-10-CM | POA: Diagnosis not present

## 2022-12-15 IMAGING — CT CT CARDIAC CORONARY ARTERY CALCIUM SCORE
3 series · 14 of 20 positions shown, 15 images · non-contrast
Comparison: None.

Addendum:
CLINICAL DATA: Cardiovascular Disease Risk stratification

EXAM:
Coronary Calcium Score
TECHNIQUE: A gated, non-contrast computed tomography scan of the heart was
performed using 3mm slice thickness. Axial images were analyzed on a
dedicated workstation. Calcium scoring of the coronary arteries was
performed using the Agatston method.

[Series 3: ax ca scr 70% (id) · axial · 0.39mm/px · z∈[+1160,+1260]mm · 6 of 71 slices shown]
[im 11/71  vessel]
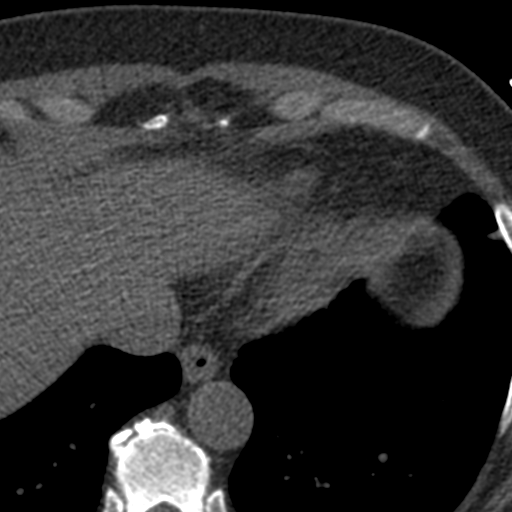
[im 21/71  vessel]
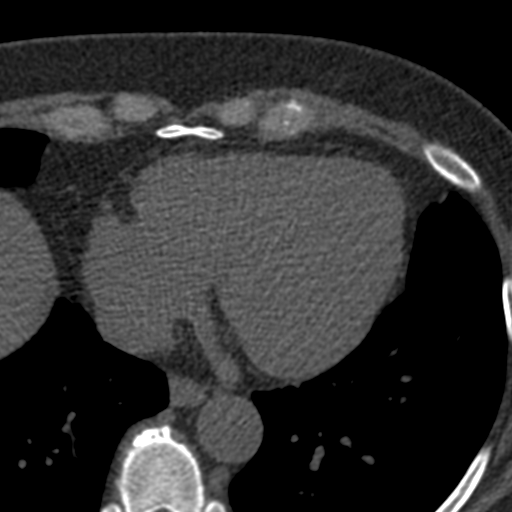
[im 31/71  vessel]
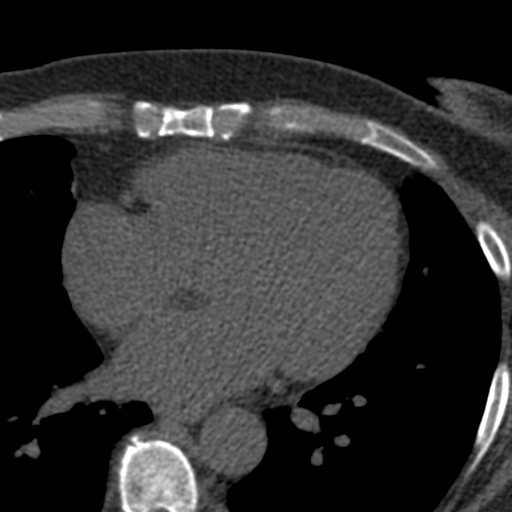
[im 41/71  vessel]
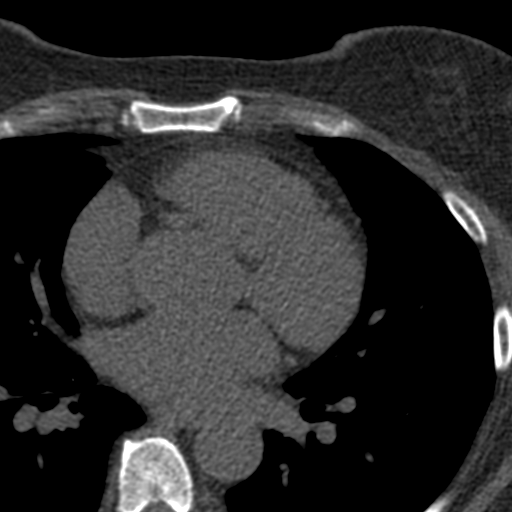
[im 51/71  vessel]
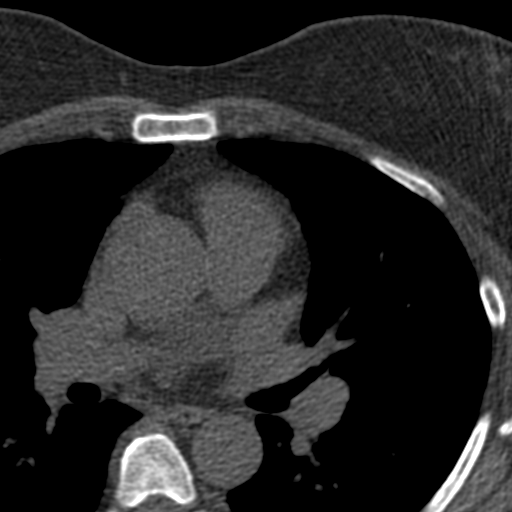
[im 61/71  vessel]
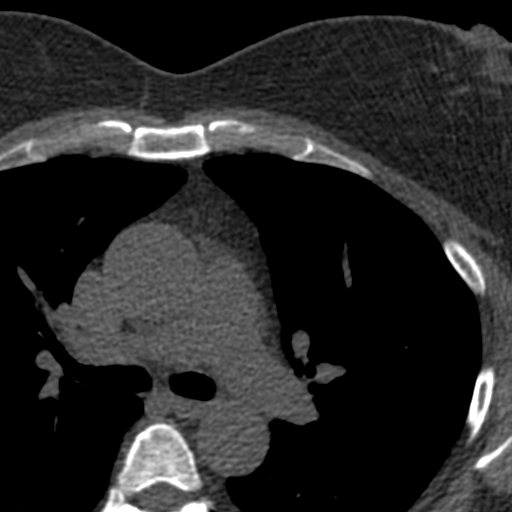

[Series 4: ax st · axial · 0.70mm/px · z∈[+1168,+1249]mm · 4 of 47 slices shown, 5 images]
[im 10/47  vessel]
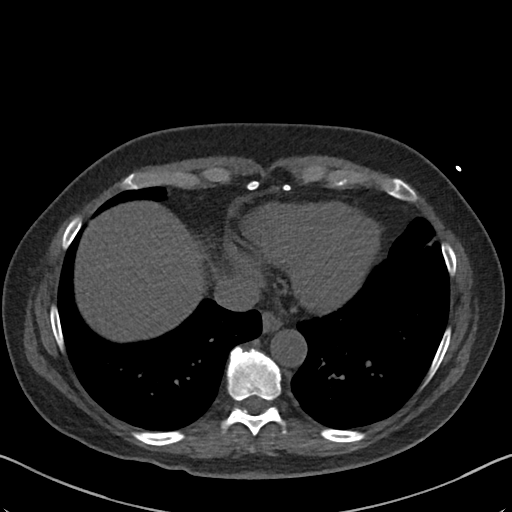
[im 10/47  lung]
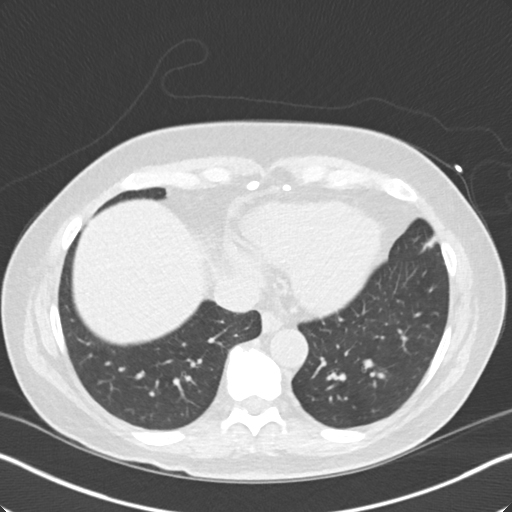
[im 19/47  vessel]
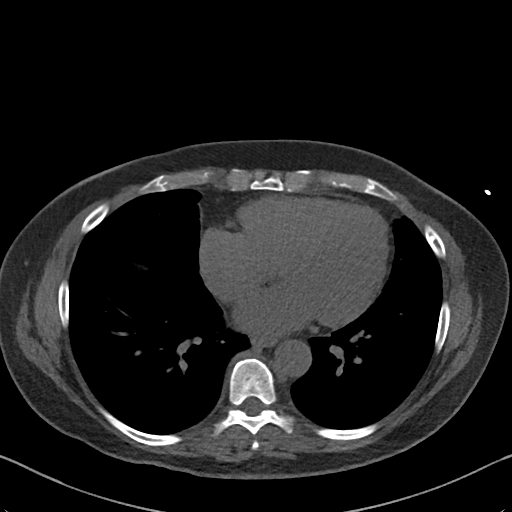
[im 28/47  vessel]
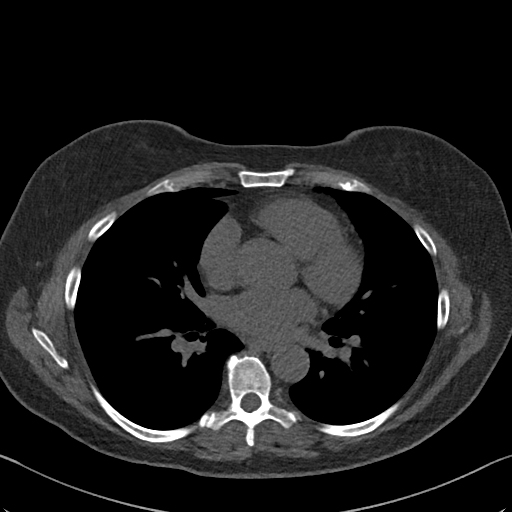
[im 37/47  vessel]
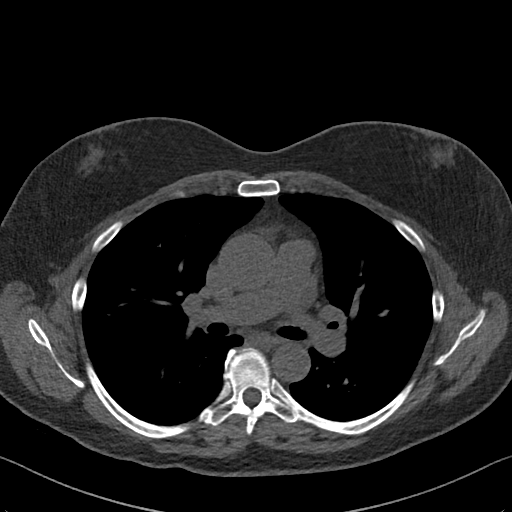

[Series 5: ax lung · axial · 0.70mm/px · z∈[+1168,+1249]mm · 4 of 47 slices shown]
[im 10/47  lung]
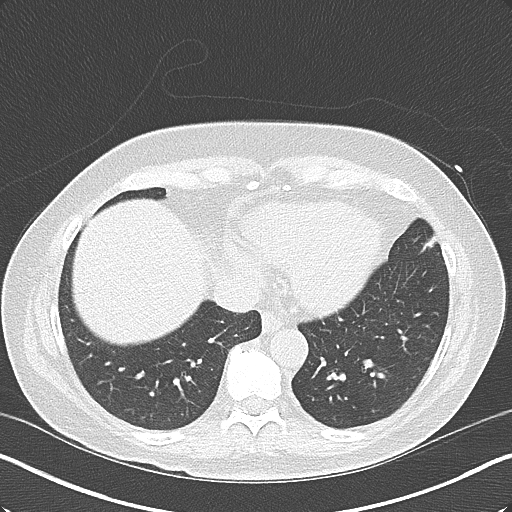
[im 19/47  lung]
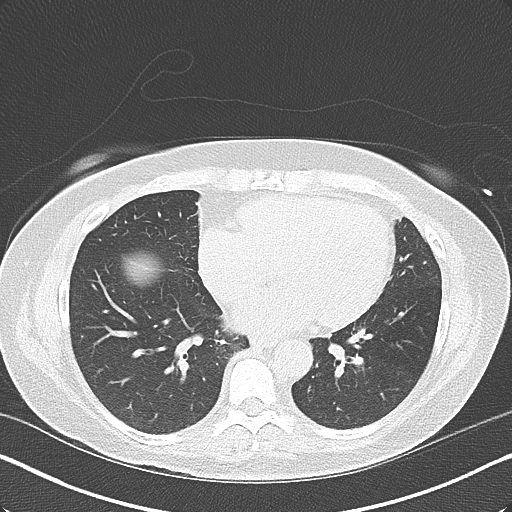
[im 28/47  lung]
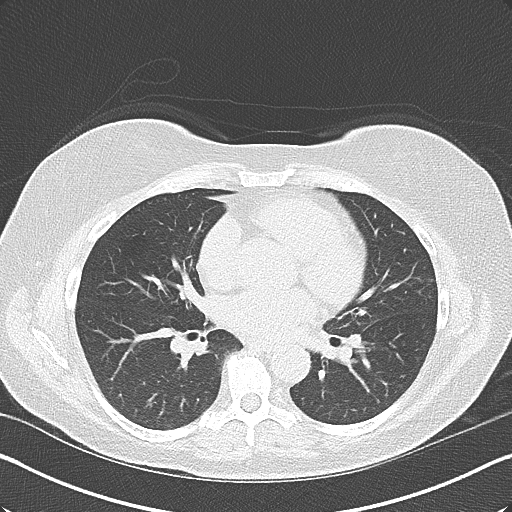
[im 37/47  lung]
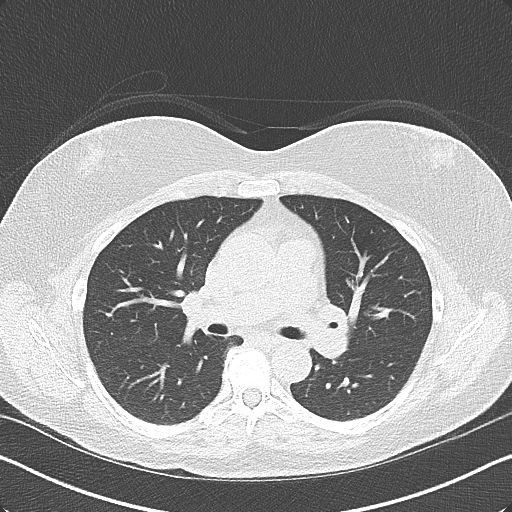

[14 of 20 positions shown; findings below may reference images not displayed]

FINDINGS: Coronary arteries: Normal origins.

Coronary Calcium Score:

Left main: 0

Left anterior descending artery: 0

Left circumflex artery:

Right coronary artery: 0

Total:

Percentile: 64th

Pericardium: Normal.

Ascending Aorta: Mildly dilated at 39mm at the bifurcation of the
main pulmonary artery. Recommend dedicated Chest CTA for further
evaluation.

Non-cardiac: See separate report from [REDACTED].
IMPRESSION: Coronary calcium score of 2.85. This was 64th percentile for age-,
race-, and sex-matched controls.



If CAC=0, it is reasonable to withhold statin therapy and reassess
in 5 to 10 years, as long as higher risk conditions are absent
(diabetes mellitus, family history of premature CHD in first degree
relatives (males <55 years; females <65 years), cigarette smoking,
or LDL >=190 mg/dL).

If CAC is 1 to 99, it is reasonable to initiate statin therapy for
patients >=55 years of age.

If CAC is >=100 or >=75th percentile, it is reasonable to initiate
statin therapy at any age.

Cardiology referral should be considered for patients with CAC
scores >=400 or >=75th percentile.

*3182 AHA/ACC/AACVPR/AAPA/ABC/JENELL/MORI/TJIHINGA/Saya/NAGA/SIMONS/RAHIMI
Guideline on the Management of Blood Cholesterol: A Report of the
American College of Cardiology/American Heart Association Task Force
on Clinical Practice Guidelines. J Am Coll Cardiol.
1021;73(24):1664-1516.

EXAM:
OVER-READ INTERPRETATION  CT CHEST

The following report is an over-read performed by radiologist Dr.
over-read does not include interpretation of cardiac or coronary
anatomy or pathology. The calcium score interpretation by the
cardiologist is attached.
FINDINGS: Limited view of the lung parenchyma demonstrates no suspicious
nodularity. Airways are normal.

Limited view of the mediastinum demonstrates no adenopathy.
Esophagus normal.

Limited view of the upper abdomen unremarkable.

Limited view of the skeleton and chest wall is unremarkable.
IMPRESSION: No significant extracardiac findings.

*** End of Addendum ***
FINDINGS: Coronary arteries: Normal origins.

Coronary Calcium Score:

Left main: 0

Left anterior descending artery: 0

Left circumflex artery:

Right coronary artery: 0

Total:

Percentile: 64th

Pericardium: Normal.

Ascending Aorta: Mildly dilated at 39mm at the bifurcation of the
main pulmonary artery. Recommend dedicated Chest CTA for further
evaluation.

Non-cardiac: See separate report from [REDACTED].
IMPRESSION: Coronary calcium score of 2.85. This was 64th percentile for age-,
race-, and sex-matched controls.



If CAC=0, it is reasonable to withhold statin therapy and reassess
in 5 to 10 years, as long as higher risk conditions are absent
(diabetes mellitus, family history of premature CHD in first degree
relatives (males <55 years; females <65 years), cigarette smoking,
or LDL >=190 mg/dL).

If CAC is 1 to 99, it is reasonable to initiate statin therapy for
patients >=55 years of age.

If CAC is >=100 or >=75th percentile, it is reasonable to initiate
statin therapy at any age.

Cardiology referral should be considered for patients with CAC
scores >=400 or >=75th percentile.

*3182 AHA/ACC/AACVPR/AAPA/ABC/JENELL/MORI/TJIHINGA/Saya/NAGA/SIMONS/RAHIMI
Guideline on the Management of Blood Cholesterol: A Report of the
American College of Cardiology/American Heart Association Task Force
on Clinical Practice Guidelines. J Am Coll Cardiol.
1021;73(24):1664-1516.

## 2022-12-22 DIAGNOSIS — Z13820 Encounter for screening for osteoporosis: Secondary | ICD-10-CM | POA: Diagnosis not present
